# Patient Record
Sex: Female | Born: 1990 | Smoking: Current every day smoker
Health system: Southern US, Community
[De-identification: ages and names within clinical notes are randomized; demographics above are authoritative.]

## PROBLEM LIST (undated history)

## (undated) ENCOUNTER — Emergency Department: Discharge status: Absent without leave | Payer: Medicaid Other

## (undated) ENCOUNTER — Emergency Department (HOSPITAL_COMMUNITY): Payer: Self-pay | Source: Home / Self Care

## (undated) DIAGNOSIS — R011 Cardiac murmur, unspecified: Secondary | ICD-10-CM

## (undated) HISTORY — DX: Cardiac murmur, unspecified: R01.1

---

## 2011-07-11 ENCOUNTER — Ambulatory Visit (INDEPENDENT_AMBULATORY_CARE_PROVIDER_SITE_OTHER): Payer: 59 | Admitting: Family Medicine

## 2011-07-11 VITALS — BP 138/83 | HR 70 | Temp 98.4°F | Resp 16 | Ht 66.0 in | Wt 155.0 lb

## 2011-07-11 DIAGNOSIS — H9209 Otalgia, unspecified ear: Secondary | ICD-10-CM

## 2011-07-11 DIAGNOSIS — H9201 Otalgia, right ear: Secondary | ICD-10-CM

## 2011-07-11 DIAGNOSIS — J029 Acute pharyngitis, unspecified: Secondary | ICD-10-CM

## 2011-07-11 DIAGNOSIS — N946 Dysmenorrhea, unspecified: Secondary | ICD-10-CM

## 2011-07-11 LAB — POCT CBC
HCT, POC: 40.2 % (ref 37.7–47.9)
MCH, POC: 30.9 pg (ref 27–31.2)
MCV: 94.1 fL (ref 80–97)
MID (cbc): 0.6 (ref 0–0.9)
Platelet Count, POC: 291 10*3/uL (ref 142–424)
RBC: 4.27 M/uL (ref 4.04–5.48)
WBC: 10.7 10*3/uL — AB (ref 4.6–10.2)

## 2011-07-11 MED ORDER — OXAPROZIN 600 MG PO TABS: ORAL_TABLET | ORAL | Status: AC

## 2011-07-11 NOTE — Progress Notes (Signed)
Subjective: 21 year old female who has been feeling bad with several things going on over the past week. For a few days she's had a sore throat and pain in the right ear hurts on the right side which swallows and fever. Her menstrual cycle started on Friday. She's had severe lower abdominal cramps. For the past week she's been having a shortness of breath and tightness in her did notice it most when she began at night. She felt her heart racing or pounding. She had to make assessment and take deep breaths. She's not been on any major stress. She does not have a history of any abdominal surgeries. She usually has some cramps with a menses but this is worse than usual. She did take some Midol. That did not help her she works at [employer].  Objective: Alert oriented healthy-appearing young lady in no major distress at this time. TMs are normal. Throat is not. Looking. There is no pus visible. Strep screen was taken. Neck supple without significant nodes. Chest is clear to auscultation. Heart regular without murmurs. Abdomen soft. Mild suprapubic tenderness.  Assessment Sore throat Right otalgia Dysmenorrhea Shortness of breath, etiology unclear  Plan: Check a strep screen. We'll also do a CBC. We'll be back within a few minutes.  Results for orders placed in visit on 07/11/11  POCT CBC      Component Value Range   WBC 10.7 (*) 4.6 - 10.2 K/uL   Lymph, poc 3.2  0.6 - 3.4   POC LYMPH PERCENT 29.8  10 - 50 %L   MID (cbc) 0.6  0 - 0.9   POC MID % 6.0  0 - 12 %M   POC Granulocyte 6.9  2 - 6.9   Granulocyte percent 64.2  37 - 80 %G   RBC 4.27  4.04 - 5.48 M/uL   Hemoglobin 13.2  12.2 - 16.2 g/dL   HCT, POC 14.7  82.9 - 47.9 %   MCV 94.1  80 - 97 fL   MCH, POC 30.9  27 - 31.2 pg   MCHC 32.8  31.8 - 35.4 g/dL   RDW, POC 56.2     Platelet Count, POC 291  142 - 424 K/uL   MPV 9.5  0 - 99.8 fL  POCT RAPID STREP A (OFFICE)      Component Value Range   Rapid Strep A Screen Negative   Negative

## 2011-07-11 NOTE — Patient Instructions (Signed)
Dysmenorrhea Menstrual pain is caused by the muscles of the uterus tightening (contracting) during a menstrual period. The muscles of the uterus contract due to the chemicals in the uterine lining. Primary dysmenorrhea is menstrual cramps that last a couple of days when you start having menstrual periods or soon after. This often begins after a teenager starts having her period. As a woman gets older or has a baby, the cramps will usually lesson or disappear. Secondary dysmenorrhea begins later in life, lasts longer, and the pain may be stronger than primary dysmenorrhea. The pain may start before the period and last a few days after the period. This type of dysmenorrhea is usually caused by an underlying problem such as:  The tissue lining the uterus grows outside of the uterus in other areas of the body (endometriosis).   The endometrial tissue, which normally lines the uterus, is found in or grows into the muscular walls of the uterus (adenomyosis).   The pelvic blood vessels are engorged with blood just before the menstrual period (pelvic congestive syndrome).   Overgrowth of cells in the lining of the uterus or cervix (polyps of the uterus or cervix).   Falling down of the uterus (prolapse) because of loose or stretched ligaments.   Depression.   Bladder problems, infection, or inflammation.   Problems with the intestine, a tumor, or irritable bowel syndrome.   Cancer of the female organs or bladder.   A severely tipped uterus.   A very tight opening or closed cervix.   Noncancerous tumors of the uterus (fibroids).   Pelvic inflammatory disease (PID).   Pelvic scarring (adhesions) from a previous surgery.   Ovarian cyst.   An intrauterine device (IUD) used for birth control.  CAUSES  The cause of menstrual pain is often unknown. SYMPTOMS   Cramping or throbbing pain in your lower abdomen.   Sometimes, a woman may also experience headaches.   Lower back pain.    Feeling sick to your stomach (nausea) or vomiting.   Diarrhea.   Sweating or dizziness.  DIAGNOSIS  A diagnosis is based on your history, symptoms, physical examination, diagnostic tests, or procedures. Diagnostic tests or procedures may include:  Blood tests.   An ultrasound.   An examination of the lining of the uterus (dilation and curettage, D&C).   An examination inside your abdomen or pelvis with a scope (laparoscopy).   X-rays.   CT Scan.   MRI.   An examination inside the bladder with a scope (cystoscopy).   An examination inside the intestine or stomach with a scope (colonoscopy, gastroscopy).  TREATMENT  Treatment depends on the cause of the dysmenorrhea. Treatment may include:  Pain medicine prescribed by your caregiver.   Birth control pills.   Hormone replacement therapy.   Nonsteroidal anti-inflammatory drugs (NSAIDs). These may help stop the production of prostaglandins.   An IUD with progesterone hormone in it.   Acupuncture.   Surgery to remove adhesions, endometriosis, ovarian cyst, or fibroids.   Removal of the uterus (hysterectomy).   Progesterone shots to stop the menstrual period.   Cutting the nerves on the sacrum that go to the female organs (presacral neurectomy).   Electric currant to the sacral nerves (sacral nerve stimulation).   Antidepressant medicine.   Psychiatric therapy, counseling, or group therapy.   Exercise and physical therapy.   Meditation and yoga therapy.  HOME CARE INSTRUCTIONS   Only take over-the-counter or prescription medicines for pain, discomfort, or fever as directed by your   caregiver.   Place a heating pad or hot water bottle on your lower back or abdomen. Do not sleep with the heating pad.   Use aerobic exercises, walking, swimming, biking, and other exercises to help lessen the cramping.   Massage to the lower back or abdomen may help.   Stop smoking.   Avoid alcohol and caffeine.   Yoga,  meditation, or acupuncture may help.  SEEK MEDICAL CARE IF:   The pain does not get better with medicine.   You have pain with sexual intercourse.  SEEK IMMEDIATE MEDICAL CARE IF:   Your pain increases and is not controlled with medicines.   You have a fever.   You develop nausea or vomiting with your period not controlled with medicine.   You have abnormal vaginal bleeding with your period.   You pass out.  MAKE SURE YOU:   Understand these instructions.   Will watch your condition.   Will get help right away if you are not doing well or get worse.  Document Released: 01/03/2005 Document Revised: 12/23/2010 Document Reviewed: 04/21/2008 [facility] Patient Information 2012 [place], [place].  Pharyngitis, Viral and Bacterial Pharyngitis is soreness (inflammation) or infection of the pharynx. It is also called a sore throat. CAUSES  Most sore throats are caused by viruses and are part of a cold. However, some sore throats are caused by strep and other bacteria. Sore throats can also be caused by post nasal drip from draining sinuses, allergies and sometimes from sleeping with an open mouth. Infectious sore throats can be spread from person to person by coughing, sneezing and sharing cups or eating utensils. TREATMENT  Sore throats that are viral usually last 3-4 days. Viral illness will get better without medications (antibiotics). Strep throat and other bacterial infections will usually begin to get better about 24-48 hours after you begin to take antibiotics. HOME CARE INSTRUCTIONS   If the caregiver feels there is a bacterial infection or if there is a positive strep test, they will prescribe an antibiotic. The full course of antibiotics must be taken. If the full course of antibiotic is not taken, you or your child may become ill again. If you or your child has strep throat and do not finish all of the medication, serious heart or kidney diseases may develop.   Drink enough water  and fluids to keep your urine clear or pale yellow.   Only take over-the-counter or prescription medicines for pain, discomfort or fever as directed by your caregiver.   Get lots of rest.   Gargle with salt water ( tsp. of salt in a glass of water) as often as every 1-2 hours as you need for comfort.   Hard candies may soothe the throat if individual is not at risk for choking. Throat sprays or lozenges may also be used.  SEEK MEDICAL CARE IF:   Large, tender lumps in the neck develop.   A rash develops.   Green, yellow-brown or bloody sputum is coughed up.   Your baby is older than 3 months with a rectal temperature of 100.5 F (38.1 C) or higher for more than 1 day.  SEEK IMMEDIATE MEDICAL CARE IF:   A stiff neck develops.   You or your child are drooling or unable to swallow liquids.   You or your child are vomiting, unable to keep medications or liquids down.   You or your child has severe pain, unrelieved with recommended medications.   You or your child are having  difficulty breathing (not due to stuffy nose).   You or your child are unable to fully open your mouth.   You or your child develop redness, swelling, or severe pain anywhere on the neck.   You have a fever.   Your baby is older than 3 months with a rectal temperature of 102 F (38.9 C) or higher.   Your baby is 55 months old or younger with a rectal temperature of 100.4 F (38 C) or higher.  MAKE SURE YOU:   Understand these instructions.   Will watch your condition.   Will get help right away if you are not doing well or get worse.  Document Released: 01/03/2005 Document Revised: 12/23/2010 Document Reviewed: 04/02/2007 [facility] Patient Information 2012 [place], [place].

## 2013-07-14 ENCOUNTER — Ambulatory Visit (INDEPENDENT_AMBULATORY_CARE_PROVIDER_SITE_OTHER): Payer: No Typology Code available for payment source | Admitting: Internal Medicine

## 2013-07-14 VITALS — BP 94/68 | HR 64 | Temp 98.1°F | Resp 16 | Ht 66.25 in | Wt 190.2 lb

## 2013-07-14 DIAGNOSIS — N390 Urinary tract infection, site not specified: Secondary | ICD-10-CM

## 2013-07-14 DIAGNOSIS — R319 Hematuria, unspecified: Secondary | ICD-10-CM

## 2013-07-14 LAB — POCT URINALYSIS DIPSTICK
Bilirubin, UA: NEGATIVE
Glucose, UA: 100
Ketones, UA: NEGATIVE
Leukocytes, UA: NEGATIVE
NITRITE UA: POSITIVE
PH UA: 7
Protein, UA: NEGATIVE
Spec Grav, UA: 1.015
UROBILINOGEN UA: 1

## 2013-07-14 LAB — POCT UA - MICROSCOPIC ONLY
CASTS, UR, LPF, POC: NEGATIVE
CRYSTALS, UR, HPF, POC: NEGATIVE
Mucus, UA: NEGATIVE
YEAST UA: NEGATIVE

## 2013-07-14 MED ORDER — CIPROFLOXACIN HCL 250 MG PO TABS: 250.0000 mg | ORAL_TABLET | Freq: Two times a day (BID) | ORAL | Status: DC

## 2013-07-14 NOTE — Progress Notes (Signed)
   Subjective:    Patient ID: Evelyn Harris, female    DOB: 05/20/1990, 23 y.o.   MRN: [id number]  HPI Complaining of dysuria frequency and urgency for the last 6 days No fever chills night sweats abdominal pain or back pain//noticed hematuria this morning No vaginal discharge Last menstrual period ended today Last UTI 2 years ago  Healthy otherwise   Review of Systems Noncontributory    Objective:   Physical Exam BP 94/68  Pulse 64  Temp(Src) 98.1 F (36.7 C) (Oral)  Resp 16  Ht 5' 6.25" (1.683 m)  Wt 190 lb 3.2 oz (86.274 kg)  BMI 30.46 kg/m2  SpO2 100%  LMP 07/07/2013 In no distress Negative CVA tenderness to percussion Abdomen nontender       Results for orders placed in visit on 07/14/13  POCT URINALYSIS DIPSTICK      Result Value Ref Range   Color, UA orange     Clarity, UA sl cloudy     Glucose, UA 100     Bilirubin, UA neg     Ketones, UA neg     Spec Grav, UA 1.015     Blood, UA small     pH, UA 7.0     Protein, UA neg     Urobilinogen, UA 1.0     Nitrite, UA positive     Leukocytes, UA Negative    POCT UA - MICROSCOPIC ONLY      Result Value Ref Range   WBC, Ur, HPF, POC 2-4     RBC, urine, microscopic 1-3     Bacteria, U Microscopic 1+     Mucus, UA neg     Epithelial cells, urine per micros 3-5     Crystals, Ur, HPF, POC neg     Casts, Ur, LPF, POC neg     Yeast, UA neg      Assessment & Plan:  Urinary tract infection-gr neg bacteria  Meds ordered this encounter  Medications  . ciprofloxacin (CIPRO) 250 MG tablet    Sig: Take 1 tablet (250 mg total) by mouth 2 (two) times daily.    Dispense:  20 tablet    Refill:  0

## 2019-04-11 ENCOUNTER — Emergency Department (HOSPITAL_COMMUNITY)
Admission: EM | Admit: 2019-04-11 | Discharge: 2019-04-11 | Disposition: A | Discharge status: Home / Self Care | Payer: Medicaid Other | Attending: Emergency Medicine | Admitting: Emergency Medicine

## 2019-04-11 ENCOUNTER — Other Ambulatory Visit: Payer: Self-pay

## 2019-04-11 ENCOUNTER — Emergency Department (HOSPITAL_COMMUNITY): Payer: Medicaid Other

## 2019-04-11 DIAGNOSIS — R112 Nausea with vomiting, unspecified: Secondary | ICD-10-CM | POA: Diagnosis not present

## 2019-04-11 DIAGNOSIS — F17200 Nicotine dependence, unspecified, uncomplicated: Secondary | ICD-10-CM | POA: Diagnosis not present

## 2019-04-11 DIAGNOSIS — R197 Diarrhea, unspecified: Secondary | ICD-10-CM | POA: Insufficient documentation

## 2019-04-11 DIAGNOSIS — M545 Low back pain: Secondary | ICD-10-CM | POA: Diagnosis present

## 2019-04-11 DIAGNOSIS — R1084 Generalized abdominal pain: Secondary | ICD-10-CM | POA: Diagnosis not present

## 2019-04-11 LAB — URINALYSIS, ROUTINE W REFLEX MICROSCOPIC
Bacteria, UA: NONE SEEN
Bilirubin Urine: NEGATIVE
Glucose, UA: NEGATIVE mg/dL
Ketones, ur: NEGATIVE mg/dL
Leukocytes,Ua: NEGATIVE
Nitrite: NEGATIVE
Protein, ur: NEGATIVE mg/dL
Specific Gravity, Urine: >1.046 — ABNORMAL HIGH (ref 1.005–1.030)
pH: 5 (ref 5.0–8.0)

## 2019-04-11 LAB — COMPREHENSIVE METABOLIC PANEL
ALT: 14 U/L (ref 0–44)
AST: 20 U/L (ref 15–41)
Albumin: 4.1 g/dL (ref 3.5–5.0)
Alkaline Phosphatase: 67 U/L (ref 38–126)
Anion gap: 13 (ref 5–15)
BUN: 17 mg/dL (ref 6–20)
CO2: 20 mmol/L — ABNORMAL LOW (ref 22–32)
Calcium: 8.7 mg/dL — ABNORMAL LOW (ref 8.9–10.3)
Chloride: 104 mmol/L (ref 98–111)
Creatinine, Ser: 0.9 mg/dL (ref 0.44–1.00)
GFR calc Af Amer: >60 mL/min (ref >60)
GFR calc non Af Amer: >60 mL/min (ref >60)
Glucose, Bld: 95 mg/dL (ref 70–99)
Potassium: 3.6 mmol/L (ref 3.5–5.1)
Sodium: 137 mmol/L (ref 135–145)
Total Bilirubin: 0.7 mg/dL (ref 0.3–1.2)
Total Protein: 7.1 g/dL (ref 6.5–8.1)

## 2019-04-11 LAB — CBC
HCT: 44.5 % (ref 36.0–46.0)
Hemoglobin: 14.2 g/dL (ref 12.0–15.0)
MCH: 29.2 pg (ref 26.0–34.0)
MCHC: 31.9 g/dL (ref 30.0–36.0)
MCV: 91.4 fL (ref 80.0–100.0)
Platelets: 289 10*3/uL (ref 150–400)
RBC: 4.87 MIL/uL (ref 3.87–5.11)
RDW: 12.6 % (ref 11.5–15.5)
WBC: 10.7 10*3/uL — ABNORMAL HIGH (ref 4.0–10.5)
nRBC: 0 % (ref 0.0–0.2)

## 2019-04-11 LAB — LIPASE, BLOOD: Lipase: 25 U/L (ref 11–51)

## 2019-04-11 LAB — I-STAT BETA HCG BLOOD, ED (MC, WL, AP ONLY): I-stat hCG, quantitative: <5 m[IU]/mL (ref <5)

## 2019-04-11 MED ORDER — SODIUM CHLORIDE 0.9% FLUSH: 3.0000 mL | Freq: Once | INTRAVENOUS | Status: DC

## 2019-04-11 MED ORDER — MORPHINE SULFATE (PF) 2 MG/ML IV SOLN
2.0000 mg | Freq: Once | INTRAVENOUS | Status: AC
  Administered 2019-04-11: 2 mg via INTRAVENOUS
  Filled 2019-04-11: qty 1

## 2019-04-11 MED ORDER — IOHEXOL 300 MG/ML  SOLN
100.0000 mL | Freq: Once | INTRAMUSCULAR | Status: AC | PRN
  Administered 2019-04-11: 100 mL via INTRAVENOUS

## 2019-04-11 MED ORDER — SODIUM CHLORIDE 0.9 % IV BOLUS
1000.0000 mL | Freq: Once | INTRAVENOUS | Status: AC
  Administered 2019-04-11: 18:00:00 1000 mL via INTRAVENOUS

## 2019-04-11 MED ORDER — PROMETHAZINE HCL 25 MG PO TABS: 25.0000 mg | ORAL_TABLET | Freq: Four times a day (QID) | ORAL | 0 refills | Status: AC | PRN

## 2019-04-11 MED ORDER — LOPERAMIDE HCL 2 MG PO CAPS: 2.0000 mg | ORAL_CAPSULE | Freq: Four times a day (QID) | ORAL | 0 refills | Status: AC | PRN

## 2019-04-11 MED ORDER — SODIUM CHLORIDE 0.9 % IV BOLUS: 1000.0000 mL | Freq: Once | INTRAVENOUS | Status: DC

## 2019-04-11 MED ORDER — ONDANSETRON HCL 4 MG/2ML IJ SOLN
4.0000 mg | Freq: Once | INTRAMUSCULAR | Status: AC
  Administered 2019-04-11: 4 mg via INTRAVENOUS
  Filled 2019-04-11: qty 2

## 2019-04-11 MED ORDER — ONDANSETRON 4 MG PO TBDP: 4.0000 mg | ORAL_TABLET | Freq: Three times a day (TID) | ORAL | 0 refills | Status: DC | PRN

## 2019-04-11 NOTE — ED Provider Notes (Signed)
Care handoff received from [facility] PA-C at shift change please see her note for full details of visit.  In short 29 year old female presents with nausea vomiting diarrhea and abdominal pain starting last night.  Lab work reassuring.  CT scan of the abdomen pelvis is pending along with a urinalysis.  Suspected gastroenteritis.  Pending no acute findings plan is discharge. Physical Exam  BP 135/87   Pulse 75   Temp 98.5 F (36.9 C) (Oral)   Resp 18   Ht 5\' 6"  (1.676 m)   Wt 93.4 kg   LMP 03/14/2019 (Approximate)   SpO2 99%   BMI 33.25 kg/m   Physical Exam Constitutional:      General: She is not in acute distress.    Appearance: Normal appearance. She is well-developed. She is not ill-appearing or diaphoretic.  HENT:     Head: Normocephalic and atraumatic.     Right Ear: External ear normal.     Left Ear: External ear normal.     Nose: Nose normal.  Eyes:     General: Vision grossly intact. Gaze aligned appropriately.     Pupils: Pupils are equal, round, and reactive to light.  Neck:     Trachea: Trachea and phonation normal. No tracheal deviation.  Pulmonary:     Effort: Pulmonary effort is normal. No respiratory distress.  Abdominal:     General: There is no distension.  Musculoskeletal:        General: Normal range of motion.     Cervical back: Normal range of motion.  Skin:    General: Skin is warm and dry.  Neurological:     Mental Status: She is alert.     GCS: GCS eye subscore is 4. GCS verbal subscore is 5. GCS motor subscore is 6.     Comments: Speech is clear and goal oriented, follows commands Major Cranial nerves without deficit, no facial droop Moves extremities without ataxia, coordination intact  Psychiatric:        Behavior: Behavior normal.     ED Course/Procedures     Procedures  MDM  Pregnancy test negative.  Lipase within normal limits no evidence of pancreatitis.  CBC shows mild leukocytosis at 10.7, no evidence of anemia.  CMP without  emergent electrolyte derangement, evidence of kidney injury or elevation of LFTs.  CT abdomen pelvis:  IMPRESSION:  1. A 4 cm left ovarian cyst. Small amount of  hemorrhagic/serosanguineous fluid within the pelvis likely related  to cyst rupture/hemorrhage.  2. No bowel obstruction. Normal appendix.  Patient updated on CT scan findings and states understanding.  Patient deferred pelvic examination by me.  She denies current pain and reports she would like something to eat and drink.  Urinalysis needs to be collected still.  Care handoff given to [facility], plan is to follow-up on urinalysis.  Anticipate discharge.  Dispo per oncoming team.  Note: Portions of this report may have been transcribed using voice recognition software. Every effort was made to ensure accuracy; however, inadvertent computerized transcription errors may still be present.   [facility] 04/11/19 2208    2209, MD 04/24/19 [phone]

## 2019-04-11 NOTE — ED Provider Notes (Signed)
[facility] EMERGENCY DEPARTMENT Provider Note   CSN: [id number] Arrival date & time: 04/11/19  1313     History Chief Complaint  Patient presents with  . Abdominal Pain  . Emesis  . Diarrhea    Evelyn Harris is a 29 y.o. female with history of dysmenorrhea and heart murmur presents for evaluation of acute onset, progressively worsening low back and abdominal pain since around 1130 last night.  She reports constant aching low back pain that radiates at times to the lower abdomen and is cramping.  She has had 5 or 6 episodes of nonbloody nonbilious emesis and a similar amount of watery nonbloody diarrhea.  She took ibuprofen with some temporary improvement in symptoms but then they worsened again.  She denies fevers, chest pain, urinary symptoms, vaginal itching or bleeding or discharge out of the ordinary.  She reports that she has similar pain in her low back on a monthly basis when she is close to starting her menstrual cycle but the diarrhea and vomiting and cramping abdominal pain are out of the ordinary for her.   The history is provided by the patient.       Past Medical History:  Diagnosis Date  . Heart murmur     Patient Active Problem List   Diagnosis Date Noted  . Dysmenorrhea 07/11/2011    No past surgical history on file.   OB History   No obstetric history on file.     Family History  Problem Relation Age of Onset  . Diabetes Maternal Grandfather     Social History   Tobacco Use  . Smoking status: Current Every Day Smoker  Substance Use Topics  . Alcohol use: No  . Drug use: No    Home Medications Prior to Admission medications   Medication Sig Start Date End Date Taking? Authorizing Provider  IBUPROFEN PO Take 1 tablet by mouth as needed (pain).   Yes [provider]  loperamide (IMODIUM) 2 MG capsule Take 1 capsule (2 mg total) by mouth 4 (four) times daily as needed for diarrhea or loose stools. 04/11/19   Nils Flack, Pattrick Bady  A, PA-C  ondansetron (ZOFRAN ODT) 4 MG disintegrating tablet Take 1 tablet (4 mg total) by mouth every 8 (eight) hours as needed for nausea or vomiting. 04/11/19   Rodell Perna A, PA-C    Allergies    Patient has no known allergies.  Review of Systems   Review of Systems  Constitutional: Negative for chills and fever.  Respiratory: Negative for shortness of breath.   Cardiovascular: Negative for chest pain.  Gastrointestinal: Positive for abdominal pain, diarrhea, nausea and vomiting.  Genitourinary: Negative for dysuria, frequency, hematuria, urgency, vaginal bleeding, vaginal discharge and vaginal pain.  All other systems reviewed and are negative.   Physical Exam Updated Vital Signs BP 135/87   Pulse 75   Temp 98.5 F (36.9 C) (Oral)   Resp 18   Ht 5\' 6"  (1.676 m)   Wt 93.4 kg   LMP 03/14/2019 (Approximate)   SpO2 99%   BMI 33.25 kg/m   Physical Exam Vitals and nursing note reviewed.  Constitutional:      General: She is not in acute distress.    Appearance: She is well-developed.  HENT:     Head: Normocephalic and atraumatic.  Eyes:     General:        Right eye: No discharge.        Left eye: No discharge.  Conjunctiva/sclera: Conjunctivae normal.  Neck:     Vascular: No JVD.     Trachea: No tracheal deviation.  Cardiovascular:     Rate and Rhythm: Normal rate and regular rhythm.     Heart sounds: Normal heart sounds.  Pulmonary:     Effort: Pulmonary effort is normal.     Breath sounds: Normal breath sounds.  Abdominal:     General: Abdomen is flat. Bowel sounds are normal. There is no distension.     Tenderness: There is generalized abdominal tenderness.     Comments: Mild generalized discomfort on palpation of the abdomen  Musculoskeletal:     Comments: Diffuse midline lumbar spine and paralumbar muscle tenderness.  No deformity, crepitus, or step-off.  5/5 strength of BLE major muscle groups.  Skin:    General: Skin is warm and dry.     Findings:  No erythema.  Neurological:     Mental Status: She is alert.  Psychiatric:        Behavior: Behavior normal.     ED Results / Procedures / Treatments   Labs (all labs ordered are listed, but only abnormal results are displayed) Labs Reviewed  COMPREHENSIVE METABOLIC PANEL - Abnormal; Notable for the following components:      Result Value   CO2 20 (*)    Calcium 8.7 (*)    All other components within normal limits  CBC - Abnormal; Notable for the following components:   WBC 10.7 (*)    All other components within normal limits  LIPASE, BLOOD  URINALYSIS, ROUTINE W REFLEX MICROSCOPIC  I-STAT BETA HCG BLOOD, ED ([facility], [facility], [facility] ONLY)    EKG None  Radiology No results found.  Procedures Procedures (including critical care time)  Medications Ordered in ED Medications  sodium chloride flush (NS) 0.9 % injection 3 mL (has no administration in time range)  ondansetron (ZOFRAN) injection 4 mg (4 mg Intravenous Given 04/11/19 1750)  morphine 2 MG/ML injection 2 mg (2 mg Intravenous Given 04/11/19 1750)  sodium chloride 0.9 % bolus 1,000 mL (1,000 mLs Intravenous Bolus from Bag 04/11/19 1750)    ED Course  I have reviewed the triage vital signs and the nursing notes.  Pertinent labs & imaging results that were available during my care of the patient were reviewed by me and considered in my medical decision making (see chart for details).    MDM Rules/Calculators/A&P                      Patient presenting for evaluation of low back pain and intermittent cramping lower abdominal pains with associated nausea vomiting and diarrhea since last night.  She is afebrile, vital signs are stable.  She is uncomfortable but nontoxic in appearance.  She is neurovascularly intact with no focal neurologic deficits.  Doubt cauda equina or spinal abscess.  Doubt dissection.  Abdomen is soft with no rebound or guarding but she has generalized discomfort on palpation of the abdomen.  Lab work reviewed  and interpreted by myself shows nonspecific leukocytosis, no anemia, no metabolic derangements, no renal insufficiency.  Doubt PID, ovarian torsion, TOA, or ectopic pregnancy.  Her pregnancy test is negative.  7:00PM  signed out to oncoming provider PA [place].  Pending CT scan of the abdomen and pelvis and UA.  If no evidence of acute surgical abdominal pathology and patient will likely be stable for discharge home with symptomatic management.  Suspect possible gastroenteritis though she has no sick contacts and  no suspicious food intake.    Final Clinical Impression(s) / ED Diagnoses Final diagnoses:  Generalized abdominal pain  Nausea vomiting and diarrhea    Rx / DC Orders ED Discharge Orders         Ordered    ondansetron (ZOFRAN ODT) 4 MG disintegrating tablet  Every 8 hours PRN     04/11/19 1900    loperamide (IMODIUM) 2 MG capsule  4 times daily PRN     04/11/19 1900           Bennye Alm 04/11/19 1914    Lorre Nick, MD 04/12/19 2309

## 2019-04-11 NOTE — Discharge Instructions (Addendum)
1. Medications: Alternate 600 mg of ibuprofen and [phone] mg of Tylenol every 3 hours as needed for pain. Do not exceed 4000 mg of Tylenol daily.  Take ibuprofen with food to avoid upset stomach.  Take Zofran as needed for nausea.  Let this medicine dissolve under your tongue and wait around 10-20 minutes before eating or drinking after taking this medication.  You can take Imodium as needed for diarrhea if it is very bothersome. 2. Treatment: rest, drink plenty of fluids, advance diet slowly.  Start with water and broth then advance to bland foods that will not upset your stomach such as crackers, mashed potatoes, and peanut butter. 3. Follow Up: Please followup with your primary doctor in 3 days for discussion of your diagnoses and further evaluation after today's visit; if you do not have a primary care doctor use the resource guide provided to find one; Please return to the ER for persistent vomiting, high fevers or worsening symptoms

## 2019-04-11 NOTE — ED Provider Notes (Signed)
Patient seen by Michela Pitcher, PA-C, signed out at end of shift.  Presents with ss/sxs of GE (N, V, D, AP, no fever) that started last night. Vomiting, diarrhea through today without relief.  CT scan = adnexal cysts (incidental to CC). She got relief with Zofran, has been drinking PO's. UA pending.   10:30 - UA negative for infection. Significantly concentrated. Will provide an additional liter of fluid. On recheck, nausea has returned. Feels like she is pending further vomiting but no emesis so far. IV Zofran ordered. Will re-evaluation but anticipate discharge after 2nd liter.   11:20 - the patient declines the second liter of fluid. She did get Zofran and feels better. She states she has to leave and get her daughter. She has been drinking PO's and is encouraged to continue this. Return precautions discussed.   She is requesting help with Rx. I found a coupon on [employer]com for phenergan that she could afford so this Rx was written for her.   She can be discharged per plan of previous treatment team.    Elpidio Anis, PA-C 04/11/19 2336    Vanetta Mulders, MD 04/24/19 [phone]

## 2019-04-11 NOTE — ED Triage Notes (Signed)
Pt here from home for lower abdominal pain radiating around to back, diarrhea, n/v since 2230 last night. Hx ovarian cyst. No pain at present.

## 2021-01-24 IMAGING — CT CT ABD-PELV W/ CM
2 of 4 series · 16 of 46 positions shown, 18 images · IV contrast (APPLIED)
Comparison: None.

CLINICAL DATA: 28-year-old female with abdominal pain.

EXAM:
CT ABDOMEN AND PELVIS WITH CONTRAST
TECHNIQUE: Multidetector CT imaging of the abdomen and pelvis was performed
using the standard protocol following bolus administration of
intravenous contrast.
CONTRAST:  100mL OMNIPAQUE IOHEXOL 300 MG/ML  SOLN

[Series 3: abdomen 5.0 · axial · 0.82mm/px · z∈[-429,-14]mm · 13 of 97 slices shown, 15 images]
[im 7/97  soft-tissue]
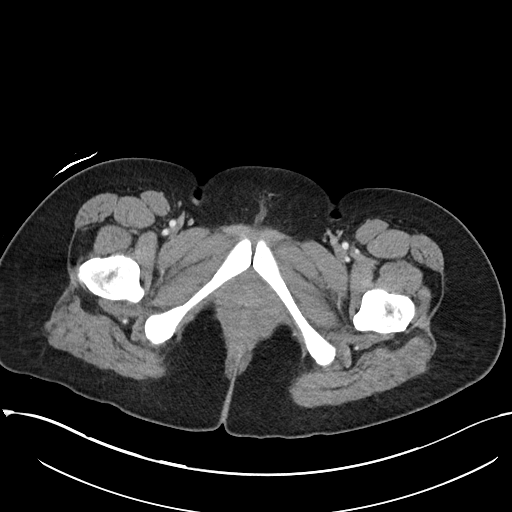
[im 7/97  bone]
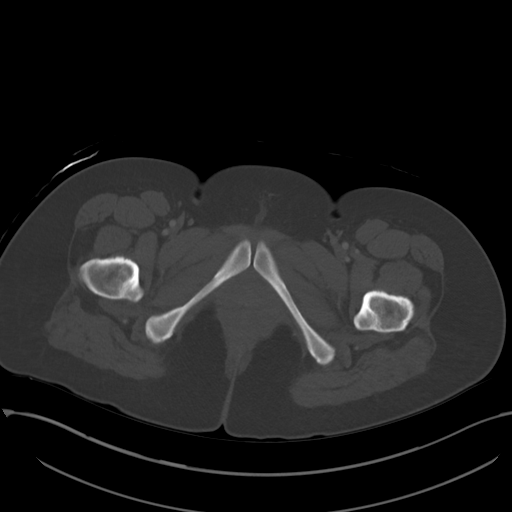
[im 13/97  soft-tissue]
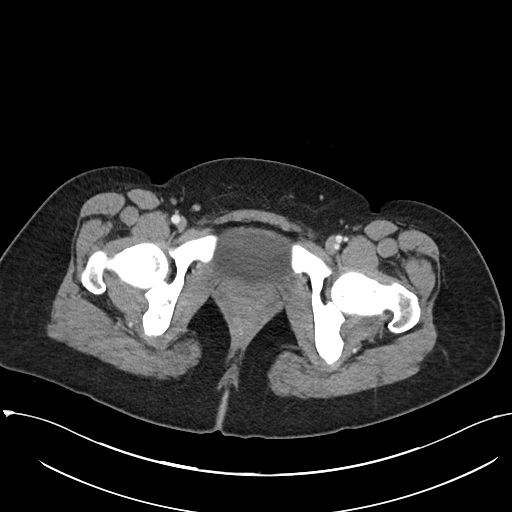
[im 20/97  soft-tissue]
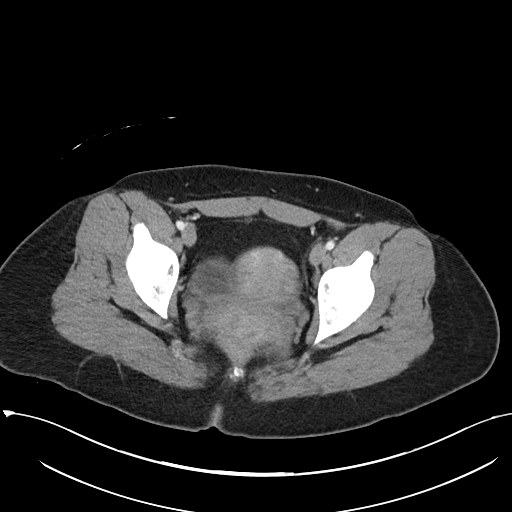
[im 26/97  soft-tissue]
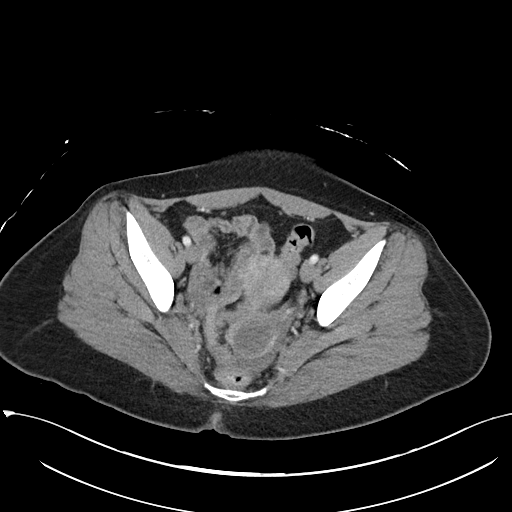
[im 33/97  soft-tissue]
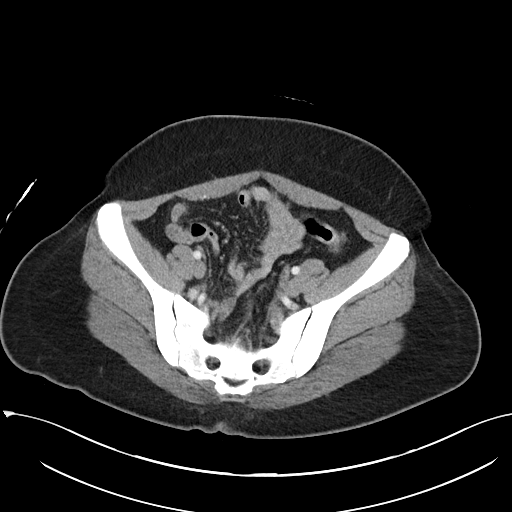
[im 39/97  soft-tissue]
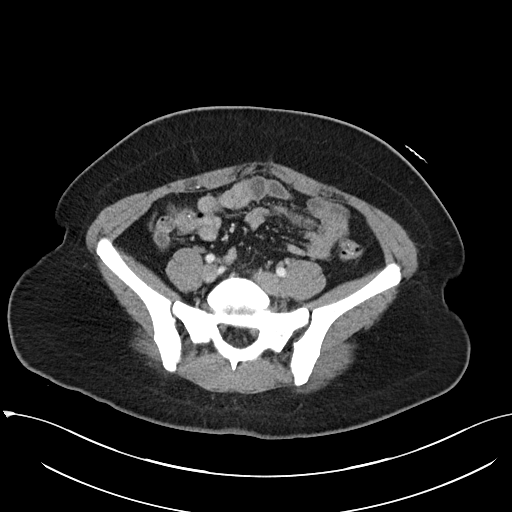
[im 52/97  soft-tissue]
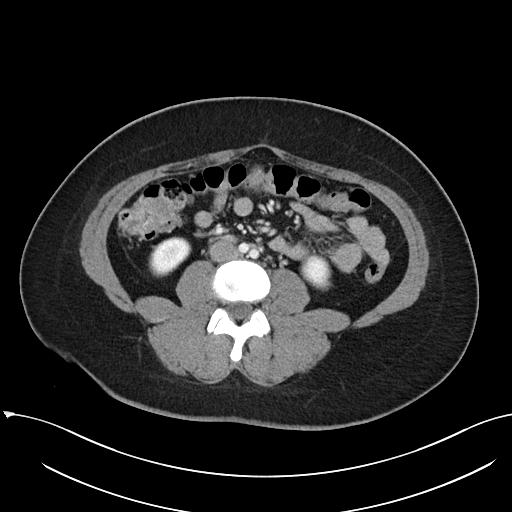
[im 58/97  soft-tissue]
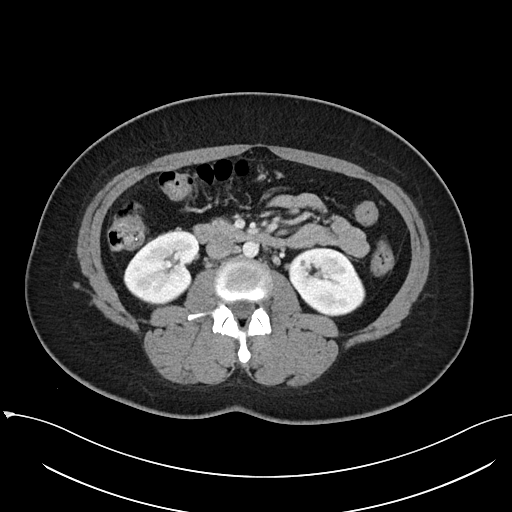
[im 65/97  soft-tissue]
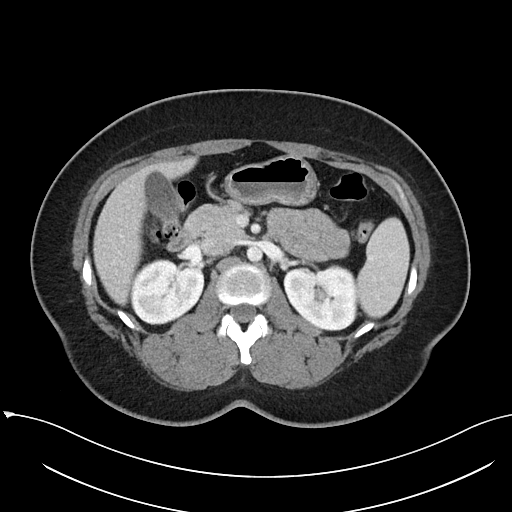
[im 65/97  bone]
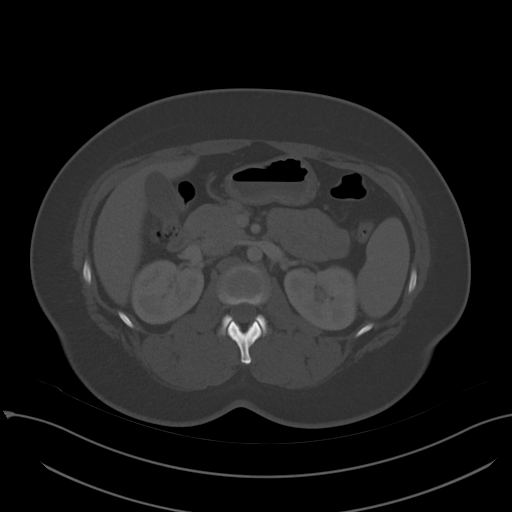
[im 71/97  soft-tissue]
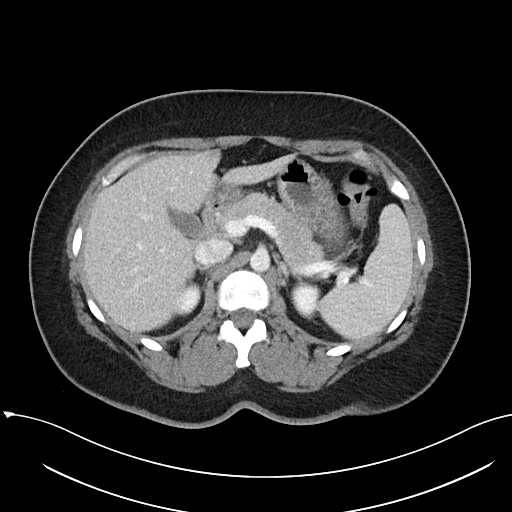
[im 77/97  soft-tissue]
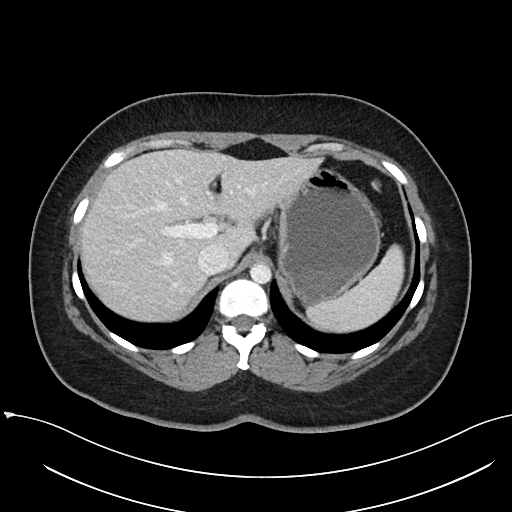
[im 84/97  soft-tissue]
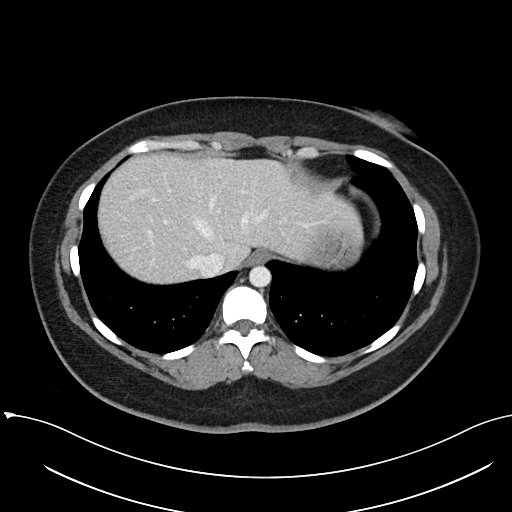
[im 90/97  soft-tissue]
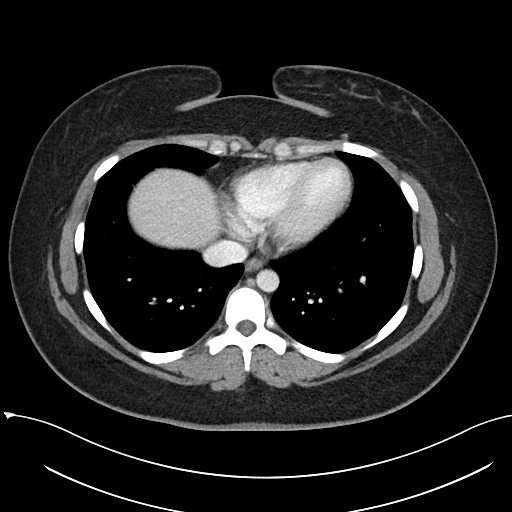

[Series 6: abdomen 3.0 mpr cor · coronal · 0.65mm/px · 3 of 90 slices shown]
[im 30/90  soft-tissue]
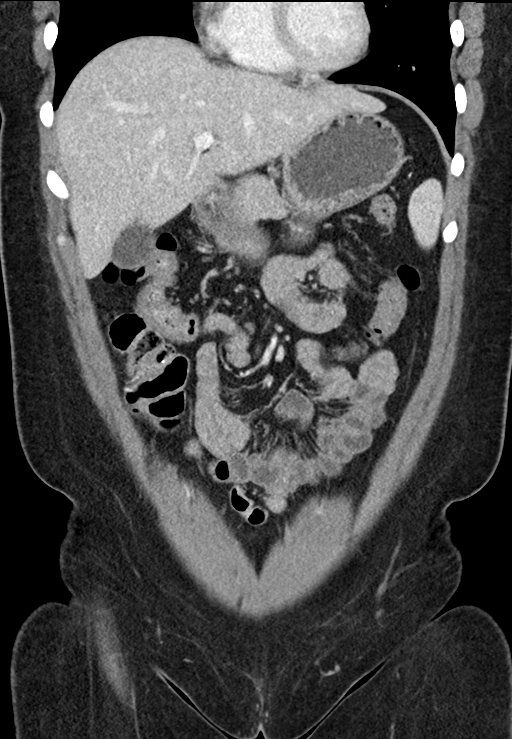
[im 40/90  soft-tissue]
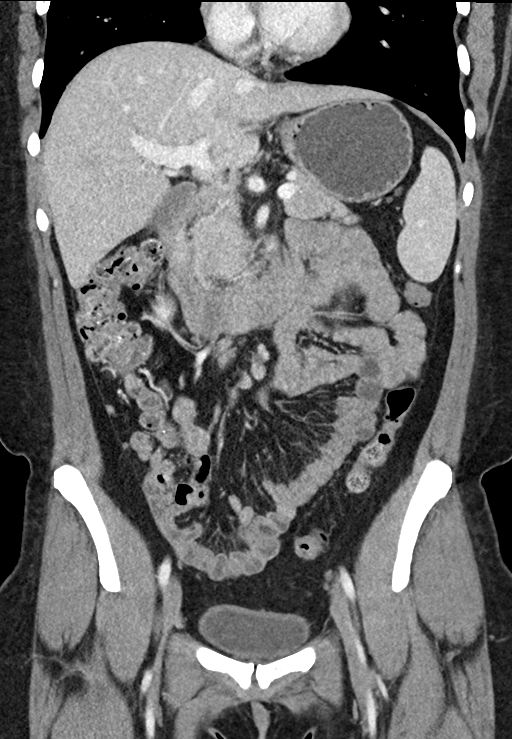
[im 50/90  soft-tissue]
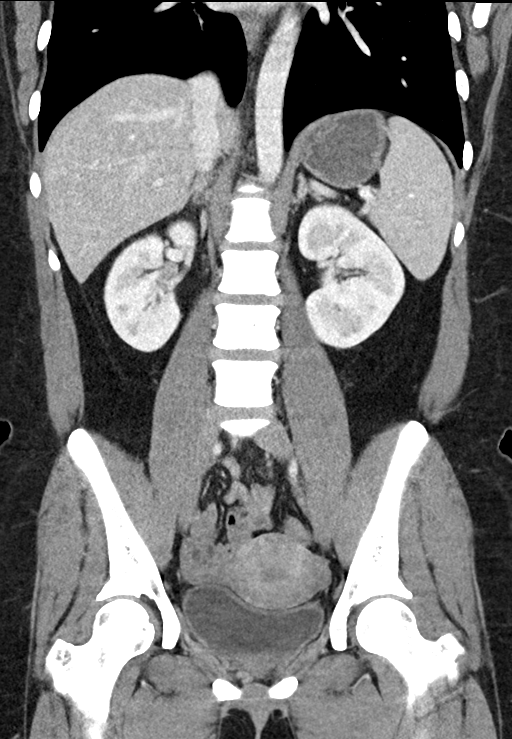

[16 of 46 positions shown; findings below may reference images not displayed]

FINDINGS: Lower chest: The visualized lung bases are clear.

No intra-abdominal free air. Small amount of high attenuating fluid
within the pelvis, likely hemorrhagic/serosanguineous fluid.

Hepatobiliary: No focal liver abnormality is seen. No gallstones,
gallbladder wall thickening, or biliary dilatation.

Pancreas: Unremarkable. No pancreatic ductal dilatation or
surrounding inflammatory changes.

Spleen: Normal in size without focal abnormality.

Adrenals/Urinary Tract: Adrenal glands are unremarkable. Kidneys are
normal, without renal calculi, focal lesion, or hydronephrosis.
Bladder is unremarkable.

Stomach/Bowel: There is no bowel obstruction or active inflammation.
The appendix is normal.

Vascular/Lymphatic: No significant vascular findings are present. No
enlarged abdominal or pelvic lymph nodes.

Reproductive: The uterus is anteverted. The right ovary is
unremarkable. There is a 4 cm left ovarian cyst.

Other: None

Musculoskeletal: No acute or significant osseous findings.
IMPRESSION: 1. A 4 cm left ovarian cyst. Small amount of
hemorrhagic/serosanguineous fluid within the pelvis likely related
to cyst rupture/hemorrhage.
2. No bowel obstruction. Normal appendix.

## 2021-07-21 ENCOUNTER — Emergency Department (HOSPITAL_COMMUNITY): Payer: Medicaid Other

## 2021-07-21 ENCOUNTER — Emergency Department (HOSPITAL_COMMUNITY)
Admission: EM | Admit: 2021-07-21 | Discharge: 2021-07-22 | Disposition: A | Discharge status: Home / Self Care | Payer: Medicaid Other | Attending: Emergency Medicine | Admitting: Emergency Medicine

## 2021-07-21 ENCOUNTER — Encounter (HOSPITAL_COMMUNITY): Payer: Self-pay | Admitting: *Deleted

## 2021-07-21 ENCOUNTER — Other Ambulatory Visit: Payer: Self-pay

## 2021-07-21 ENCOUNTER — Ambulatory Visit: Payer: Self-pay | Admitting: *Deleted

## 2021-07-21 DIAGNOSIS — R29810 Facial weakness: Secondary | ICD-10-CM | POA: Diagnosis present

## 2021-07-21 DIAGNOSIS — G510 Bell's palsy: Secondary | ICD-10-CM | POA: Diagnosis not present

## 2021-07-21 DIAGNOSIS — E876 Hypokalemia: Secondary | ICD-10-CM | POA: Diagnosis not present

## 2021-07-21 LAB — CBC WITH DIFFERENTIAL/PLATELET
Abs Immature Granulocytes: 0.03 10*3/uL (ref 0.00–0.07)
Basophils Absolute: 0.1 10*3/uL (ref 0.0–0.1)
Basophils Relative: 1 %
Eosinophils Absolute: 0.3 10*3/uL (ref 0.0–0.5)
Eosinophils Relative: 3 %
HCT: 38.1 % (ref 36.0–46.0)
Hemoglobin: 12.3 g/dL (ref 12.0–15.0)
Immature Granulocytes: 0 %
Lymphocytes Relative: 34 %
Lymphs Abs: 3.8 10*3/uL (ref 0.7–4.0)
MCH: 29.3 pg (ref 26.0–34.0)
MCHC: 32.3 g/dL (ref 30.0–36.0)
MCV: 90.7 fL (ref 80.0–100.0)
Monocytes Absolute: 0.7 10*3/uL (ref 0.1–1.0)
Monocytes Relative: 6 %
Neutro Abs: 6.3 10*3/uL (ref 1.7–7.7)
Neutrophils Relative %: 56 %
Platelets: 350 10*3/uL (ref 150–400)
RBC: 4.2 MIL/uL (ref 3.87–5.11)
RDW: 13.1 % (ref 11.5–15.5)
WBC: 11.3 10*3/uL — ABNORMAL HIGH (ref 4.0–10.5)
nRBC: 0 % (ref 0.0–0.2)

## 2021-07-21 LAB — COMPREHENSIVE METABOLIC PANEL
ALT: 15 U/L (ref 0–44)
AST: 18 U/L (ref 15–41)
Albumin: 3.8 g/dL (ref 3.5–5.0)
Alkaline Phosphatase: 72 U/L (ref 38–126)
Anion gap: 8 (ref 5–15)
BUN: 12 mg/dL (ref 6–20)
CO2: 20 mmol/L — ABNORMAL LOW (ref 22–32)
Calcium: 8.7 mg/dL — ABNORMAL LOW (ref 8.9–10.3)
Chloride: 110 mmol/L (ref 98–111)
Creatinine, Ser: 0.87 mg/dL (ref 0.44–1.00)
GFR, Estimated: >60 mL/min (ref >60)
Glucose, Bld: 84 mg/dL (ref 70–99)
Potassium: 3.3 mmol/L — ABNORMAL LOW (ref 3.5–5.1)
Sodium: 138 mmol/L (ref 135–145)
Total Bilirubin: 0.3 mg/dL (ref 0.3–1.2)
Total Protein: 6.5 g/dL (ref 6.5–8.1)

## 2021-07-21 LAB — I-STAT BETA HCG BLOOD, ED (MC, WL, AP ONLY): I-stat hCG, quantitative: <5 m[IU]/mL (ref <5)

## 2021-07-21 NOTE — Telephone Encounter (Signed)
  Chief Complaint: R facial drooping Symptoms: facial drooping, eye and tongue sensation Frequency: started 1-2 pm today Pertinent Negatives: Patient denies headache, dizziness, vision loss, double vision, changes in speech, unsteady on your feet Disposition: [x] ED /[] Urgent Care (no appt availability in office) / [] Appointment(In office/virtual)/ []  [facility]/ [] Home Care/ [] Refused Recommended Disposition /[] [facility]/ []  Follow-up with PCP Additional Notes: Patient advised per protocol- ED/PCP- she has not reached out to pCP and wants to know if UC could see her- patient advised she can go to UC- if they feel she needs something more- they will send her on.

## 2021-07-21 NOTE — ED Provider Triage Note (Signed)
Emergency Medicine Provider Triage Evaluation Note  Evelyn Harris , a 31 y.o. female  was evaluated in triage.  Pt complains of right-sided facial droop, "fuzzy feeling" in the right side of her tongue, impaired taste and feeling like the right side of her face is swollen.  Her symptoms began last night including the tongue and taste symptoms.  She noticed approximately 4-1/2 hours ago that she had drooping on the right side of her face.  She noticed around this time as well that the right side of her face felt swollen.  Denies any changes to sensation of face or extremities.  No blurry vision.  Review of Systems  Positive: Right-sided facial droop, impaired taste, facial swelling Negative: Numbness, weakness  Physical Exam  BP 128/90 (BP Location: Right Arm)   Pulse 86   Temp 98.3 F (36.8 C) (Oral)   Resp 16   Ht 5\' 6"  (1.676 m)   Wt 95.3 kg   LMP 06/21/2021   SpO2 99%   BMI 33.89 kg/m  Gen:   Awake, no distress   Resp:  Normal effort  MSK:   Moves extremities without difficulty  Other:  Right sided facial droop noticed with smiling.  Unable to raise eyebrow on the right side.  Normal sensation to light touch of face, upper and lower extremities.  Strength 5/5 in bilateral upper and lower extremities.  Symmetric tongue protrusion.  Normal speech.  Medical Decision Making  Medically screening exam initiated at 5:18 PM.  Appropriate orders placed.  SHALAINA GUARDIOLA was informed that the remainder of the evaluation will be completed by another provider, this initial triage assessment does not replace that evaluation, and the importance of remaining in the ED until their evaluation is complete.  No indication for code stroke but will order work-up   Franki Monte, PA-C 07/21/21 1719

## 2021-07-21 NOTE — Telephone Encounter (Signed)
Reason for Disposition  Bell's palsy suspected (i.e., weakness on only one side of the face, developing over hours to days, no other symptoms)  Answer Assessment - Initial Assessment Questions 1. SYMPTOM: "What is the main symptom you are concerned about?" (e.g., weakness, numbness)     R facial drooping 2. ONSET: "When did this start?" (minutes, hours, days; while sleeping)     Today- 1-2 pm 3. LAST NORMAL: "When was the last time you (the patient) were normal (no symptoms)?"     Before normal 4. PATTERN "Does this come and go, or has it been constant since it started?"  "Is it present now?"     Constant- present now 5. CARDIAC SYMPTOMS: "Have you had any of the following symptoms: chest pain, difficulty breathing, palpitations?"     Palpitations earlier 6. NEUROLOGIC SYMPTOMS: "Have you had any of the following symptoms: headache, dizziness, vision loss, double vision, changes in speech, unsteady on your feet?"     R eye feels weird 7. OTHER SYMPTOMS: "Do you have any other symptoms?"     R side of the tongue- strange sensation 8. PREGNANCY: "Is there any chance you are pregnant?" "When was your last menstrual period?"  Protocols used: Neurologic Deficit-A-AH

## 2021-07-21 NOTE — ED Triage Notes (Signed)
Rt sided face numbness since last night tongue had a strange feeling  she has a sl rt sided droop  both eyes close normally  no  pain no other symptoms  lmp last month

## 2021-07-22 MED ORDER — VALACYCLOVIR HCL 1 G PO TABS: 1000.0000 mg | ORAL_TABLET | Freq: Three times a day (TID) | ORAL | 0 refills | Status: AC

## 2021-07-22 MED ORDER — VALACYCLOVIR HCL 500 MG PO TABS
1000.0000 mg | ORAL_TABLET | Freq: Once | ORAL | Status: AC
  Administered 2021-07-22: 1000 mg via ORAL
  Filled 2021-07-22: qty 2

## 2021-07-22 MED ORDER — PREDNISONE 20 MG PO TABS
60.0000 mg | ORAL_TABLET | Freq: Once | ORAL | Status: AC
  Administered 2021-07-22: 60 mg via ORAL
  Filled 2021-07-22: qty 3

## 2021-07-22 MED ORDER — PREDNISONE 20 MG PO TABS: 40.0000 mg | ORAL_TABLET | Freq: Every day | ORAL | 0 refills | Status: AC

## 2021-07-22 MED ORDER — ACETAMINOPHEN 325 MG PO TABS
650.0000 mg | ORAL_TABLET | Freq: Once | ORAL | Status: AC
  Administered 2021-07-22: 650 mg via ORAL
  Filled 2021-07-22: qty 2

## 2021-07-22 NOTE — ED Provider Notes (Signed)
[facility]ENCY DEPT [facility] Emergency Department Provider Note MRN:  [id number]  Arrival date & time: 07/22/21     Chief Complaint   face numbness   History of Present Illness   Evelyn Harris is a 31 y.o. year-old female presents to the ED with chief complaint of right-sided facial droop.  She states the symptoms started today.  She noticed it when looking in a mirror.  She states that she has had some dribbling out of the right side of her mouth when she drinks with a straw.  She states that it is difficult to close her right eye completely.  She reports a weird taste.  She denies weakness in her arms or legs.  History provided by patient.   Review of Systems  Pertinent review of systems noted in HPI.    Physical Exam   Vitals:   07/21/21 2038 07/22/21 0002  BP: 133/82 (!) 142/78  Pulse: 90 82  Resp: 18 17  Temp: 98.9 F (37.2 C)   SpO2: 98% 98%    CONSTITUTIONAL:  well-appearing, NAD NEURO:  Alert and oriented x 3, right sided facial droop including the forehead and R eyebrow EYES:  eyes equal and reactive ENT/NECK:  Supple, no stridor  CARDIO:  normal rate, regular rhythm, appears well-perfused  PULM:  No respiratory distress, CTAB GI/GU:  non-distended,  MSK/SPINE:  No gross deformities, no edema, moves all extremities  SKIN:  no rash, atraumatic   *Additional and/or pertinent findings included in MDM below  Diagnostic and Interventional Summary    EKG Interpretation  Date/Time:    Ventricular Rate:    PR Interval:    QRS Duration:   QT Interval:    QTC Calculation:   R Axis:     Text Interpretation:         Labs Reviewed  COMPREHENSIVE METABOLIC PANEL - Abnormal; Notable for the following components:      Result Value   Potassium 3.3 (*)    CO2 20 (*)    Calcium 8.7 (*)    All other components within normal limits  CBC WITH DIFFERENTIAL/PLATELET - Abnormal; Notable for the following components:   WBC 11.3 (*)    All other components  within normal limits  I-STAT BETA HCG BLOOD, ED ([facility], [facility], [facility] ONLY)    CT HEAD WO CONTRAST ( )  Final Result      Medications  valACYclovir (VALTREX) tablet 1,000 mg (has no administration in time range)  predniSONE (DELTASONE) tablet 60 mg (has no administration in time range)     Procedures  /  Critical Care Procedures  ED Course and Medical Decision Making  I have reviewed the triage vital signs, the nursing notes, and pertinent available records from the EMR.  Social Determinants Affecting Complexity of Care: Patient has no clinically significant social determinants affecting this chief complaint..   ED Course:   Patient here with right sided facial numbness.  Top differential diagnoses include stroke, bell's palsy. Medical Decision Making Patient here with right-sided facial droop.  Onset today.  Symptoms include the right forehead, eyelid and eyebrow.  Symptoms are consistent with Bell's palsy.  Will start Valtrex and prednisone.  Discussed with patient that she should tape her eyelid closed at night and use artificial tears in the day.  PCP follow-up.  Amount and/or Complexity of Data Reviewed Labs: ordered.    Details: Mild hypokalemia of 3.3 Radiology: independent interpretation performed.    Details: CT ordered in triage is negative for  ICH  Risk Prescription drug management.     Consultants: No consultations were needed in caring for this patient.   Treatment and Plan: Emergency department workup does not suggest an emergent condition requiring admission or immediate intervention beyond  what has been performed at this time. The patient is safe for discharge and has  been instructed to return immediately for worsening symptoms, change in  symptoms or any other concerns    Final Clinical Impressions(s) / ED Diagnoses     ICD-10-CM   1. Bell's palsy  G51.0       ED Discharge Orders          Ordered    predniSONE (DELTASONE) 20 MG tablet  Daily         07/22/21 0110    valACYclovir (VALTREX) 1000 MG tablet  3 times daily        07/22/21 0110              Discharge Instructions Discussed with and Provided to Patient:     Discharge Instructions      Use artificial tears throughout the day.  Tape your eyelid closed at night.  Take medications as prescribed.  Follow-up with your primary care doctor.  If you do not have a primary care doctor, I recommend that you follow-up with the neurologist listed.       Roxy Horseman, PA-C 07/22/21 0110    Melene Plan, DO 07/22/21 [phone]

## 2021-07-22 NOTE — Discharge Instructions (Signed)
Use artificial tears throughout the day.  Tape your eyelid closed at night.  Take medications as prescribed.  Follow-up with your primary care doctor.  If you do not have a primary care doctor, I recommend that you follow-up with the neurologist listed.
